# Patient Record
Sex: Female | Born: 1995 | Race: Black or African American | Hispanic: No | Marital: Single | Smoking: Never smoker
Health system: Southern US, Community
[De-identification: ages and names within clinical notes are randomized; demographics above are authoritative.]

---
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
## 2016-03-16 IMAGING — US US OB COMP LESS 14 WK
1 series · 14 of 27 positions shown · non-contrast
Comparison: None.

CLINICAL DATA: Vaginal bleeding first-trimester pregnancy. Initial
encounter

EXAM:
OBSTETRIC <14 WK ULTRASOUND
TECHNIQUE: Transabdominal ultrasound was performed for evaluation of the
gestation as well as the maternal uterus and adnexal regions.

[Series 1: us ob comp less 14 wk · 0.22mm/px · 14 of 27 slices shown]
[im 1/27]
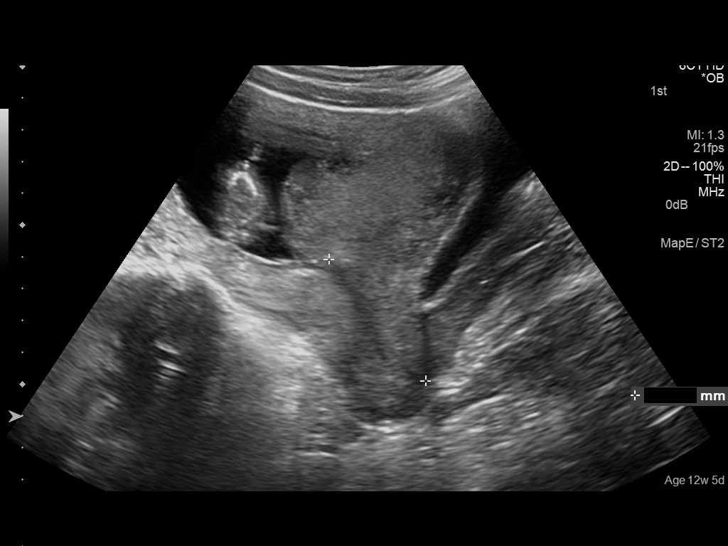
[im 3/27]
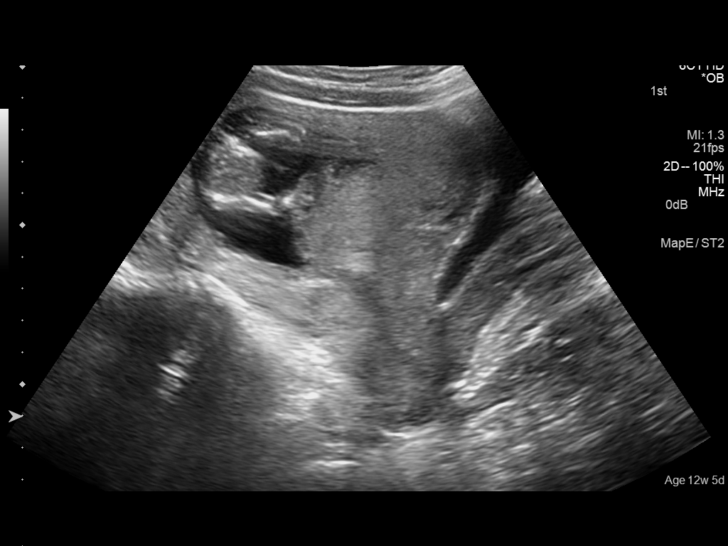
[im 5/27]
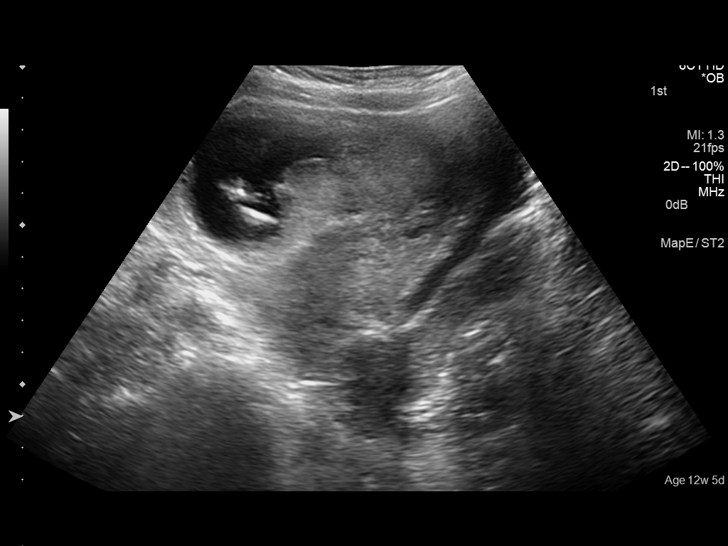
[im 7/27]
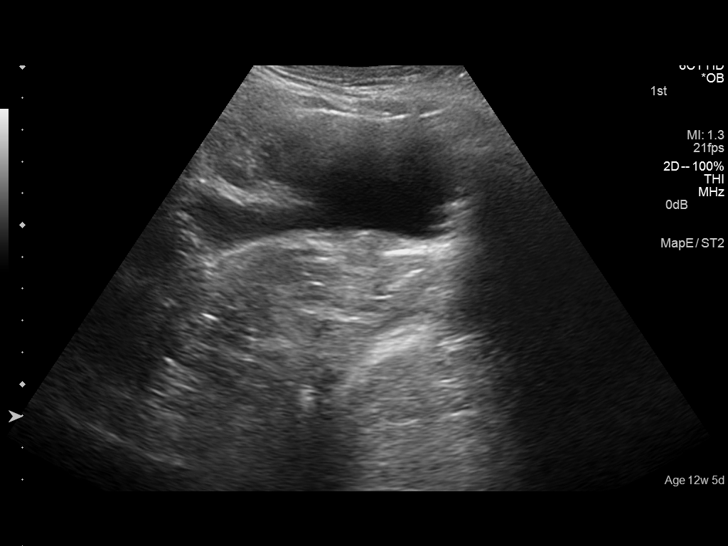
[im 9/27]
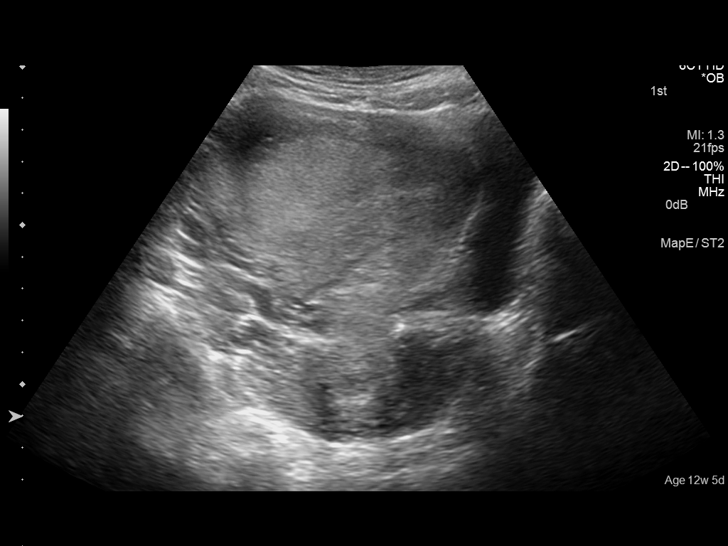
[im 11/27]
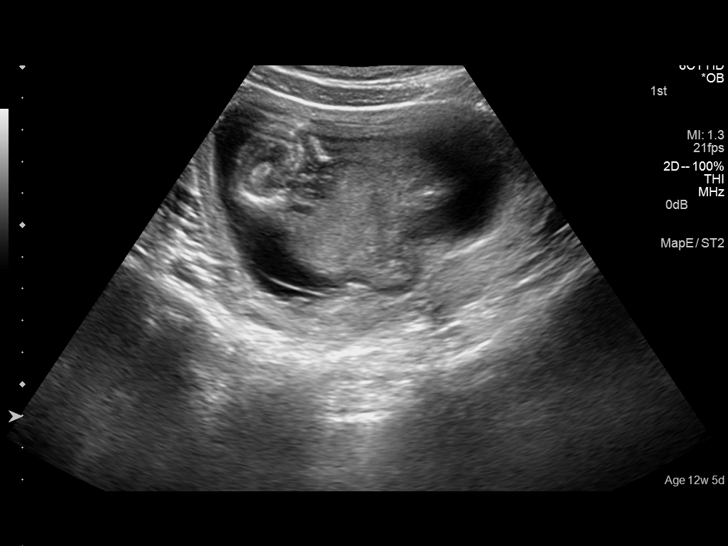
[im 13/27]
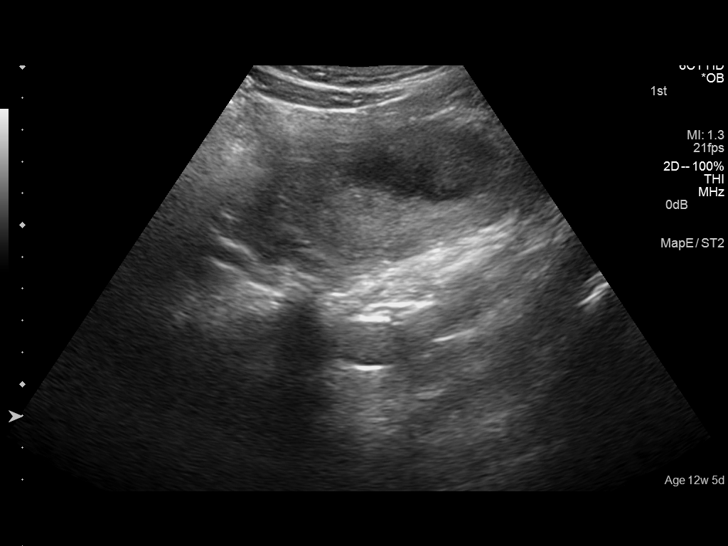
[im 15/27]
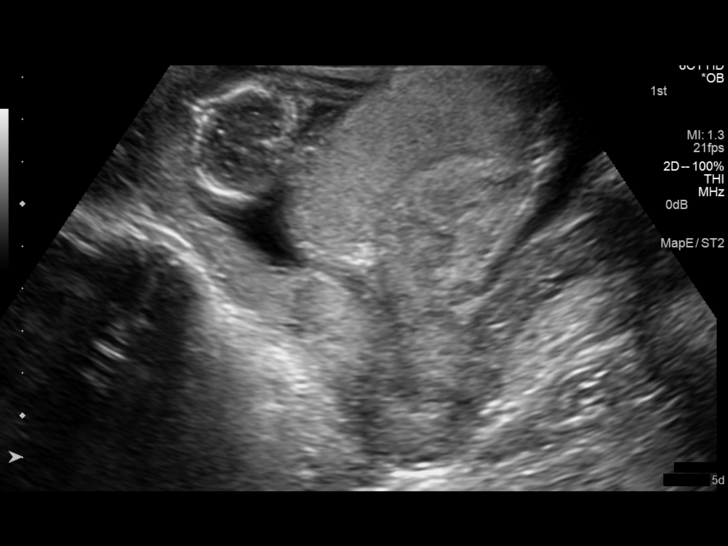
[im 17/27]
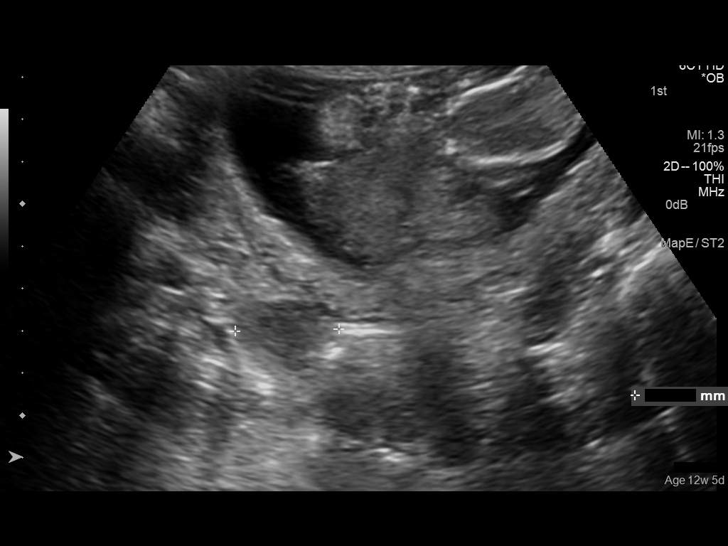
[im 19/27]
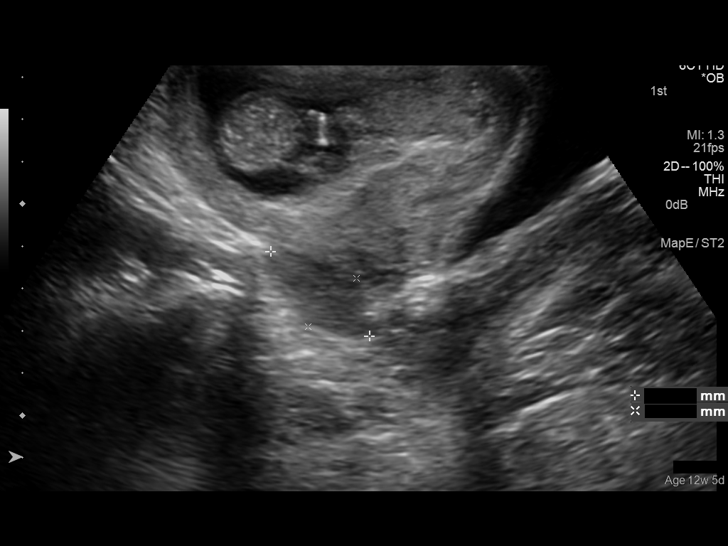
[im 21/27]
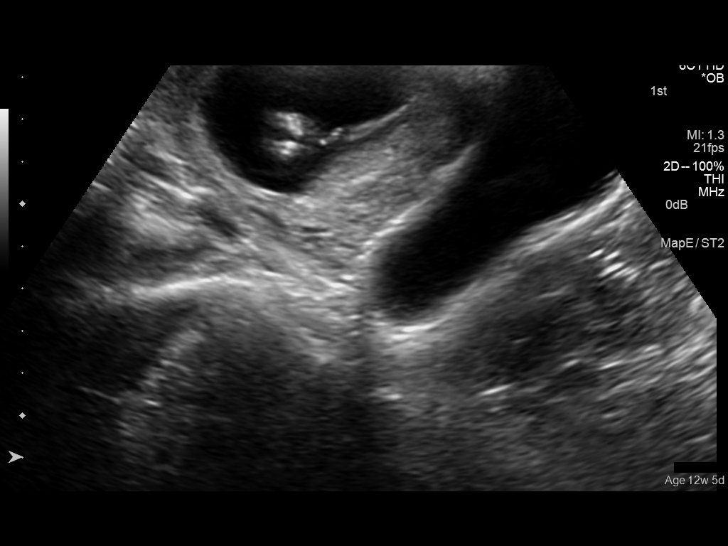
[im 23/27]
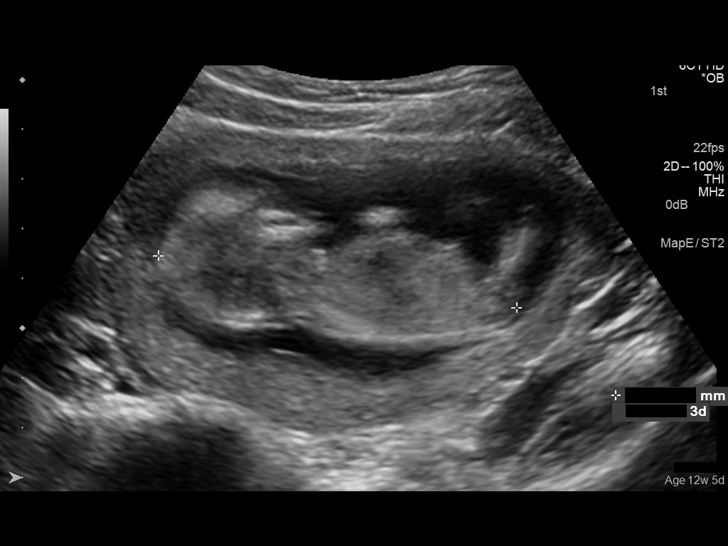
[im 25/27]
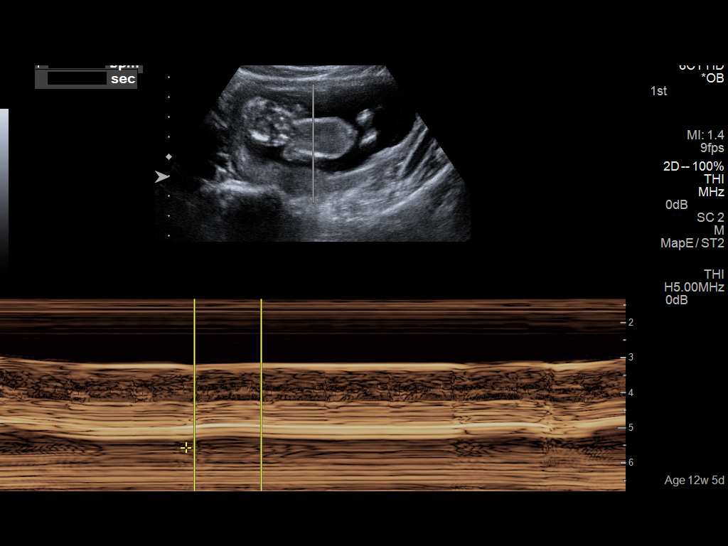
[im 27/27]
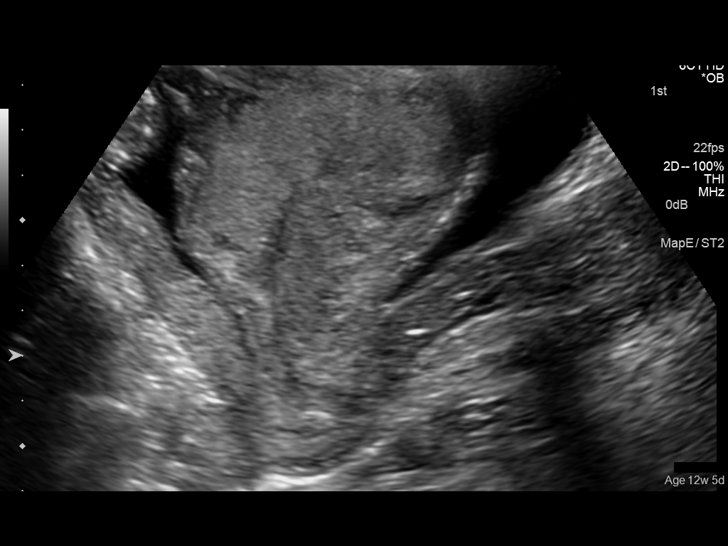

[14 of 27 positions shown; findings below may reference images not displayed]

FINDINGS: Intrauterine gestational sac: Visualized/normal in shape.

Yolk sac:  No longer seen

Embryo:  Present and unremarkable

Cardiac Activity: Present

Heart Rate: 152 bpm

CRL:   73.1  mm   13 w 3 d                  US EDC: 03/04/2015

Maternal uterus/adnexae: The placenta is located anteriorly and has
and edge overlapping the internal cervical os. The cervix appears
funnel shaped but closed. No evidence of retroplacental collection.
The ovaries are symmetric in size and normal in appearance. No free
pelvic fluid.
IMPRESSION: 1. Single living intrauterine gestation with estimated age 13 weeks
3 days.
2. The placenta overlaps the internal cervical os. No hematoma
identified.
